# Patient Record
Sex: Male | Born: 1975 | Race: White | Hispanic: No | Marital: Married | Smoking: Never smoker
Health system: Southern US, Community
[De-identification: ages and names within clinical notes are randomized; demographics above are authoritative.]

## PROBLEM LIST (undated history)

## (undated) DIAGNOSIS — Z789 Other specified health status: Secondary | ICD-10-CM

## (undated) HISTORY — PX: NO PAST SURGERIES: SHX2092

---

## 2012-08-29 ENCOUNTER — Ambulatory Visit: Payer: Self-pay | Admitting: Physical Medicine and Rehabilitation

## 2012-10-31 ENCOUNTER — Other Ambulatory Visit: Payer: Self-pay | Admitting: Neurosurgery

## 2012-11-08 ENCOUNTER — Encounter (HOSPITAL_COMMUNITY): Payer: Self-pay

## 2012-11-11 ENCOUNTER — Encounter (HOSPITAL_COMMUNITY): Payer: Self-pay

## 2012-11-11 ENCOUNTER — Encounter (HOSPITAL_COMMUNITY)
Admission: RE | Admit: 2012-11-11 | Discharge: 2012-11-11 | Disposition: A | Discharge status: Home / Self Care | Payer: 59 | Source: Ambulatory Visit | Attending: Neurosurgery | Admitting: Neurosurgery

## 2012-11-11 HISTORY — DX: Other specified health status: Z78.9

## 2012-11-11 LAB — URINALYSIS, ROUTINE W REFLEX MICROSCOPIC
Ketones, ur: NEGATIVE mg/dL
Leukocytes, UA: NEGATIVE
Nitrite: NEGATIVE
Protein, ur: NEGATIVE mg/dL
pH: 6 (ref 5.0–8.0)

## 2012-11-11 LAB — CBC WITH DIFFERENTIAL/PLATELET
Basophils Absolute: 0 10*3/uL (ref 0.0–0.1)
Eosinophils Absolute: 0.2 10*3/uL (ref 0.0–0.7)
Eosinophils Relative: 3 % (ref 0–5)
Lymphocytes Relative: 40 % (ref 12–46)
MCV: 77.2 fL — ABNORMAL LOW (ref 78.0–100.0)
Neutrophils Relative %: 48 % (ref 43–77)
Platelets: 249 10*3/uL (ref 150–400)
RDW: 14.4 % (ref 11.5–15.5)
WBC: 6.1 10*3/uL (ref 4.0–10.5)

## 2012-11-11 LAB — BASIC METABOLIC PANEL
CO2: 30 mEq/L (ref 19–32)
Calcium: 8.6 mg/dL (ref 8.4–10.5)
Creatinine, Ser: 0.94 mg/dL (ref 0.50–1.35)
GFR calc Af Amer: >90 mL/min (ref >90)
GFR calc non Af Amer: >90 mL/min (ref >90)
Sodium: 139 mEq/L (ref 135–145)

## 2012-11-11 NOTE — Pre-Procedure Instructions (Signed)
ZACH TIETJE  11/11/2012   Your procedure is scheduled on:  11/15/12  Report to [facility] at 530 AM.  Call this number if you have problems the morning of surgery: [phone]   Remember:   Do not eat food or drink liquids after midnight.   Take these medicines the morning of surgery with A SIP OF WATER: neurontin,pain med., carafate   Do not wear jewelry, make-up or nail polish.  Do not wear lotions, powders, or perfumes. You may wear deodorant.  Do not shave 48 hours prior to surgery. Men may shave face and neck.  Do not bring valuables to the hospital.  [facility] is not responsible                   for any belongings or valuables.  Contacts, dentures or bridgework may not be worn into surgery.  Leave suitcase in the car. After surgery it may be brought to your room.  For patients admitted to the hospital, checkout time is 11:00 AM the day of  discharge.   Patients discharged the day of surgery will not be allowed to drive  home.  Name and phone number of your driver: family  Special Instructions: Shower using CHG 2 nights before surgery and the night before surgery.  If you shower the day of surgery use CHG.  Use special wash - you have one bottle of CHG for all showers.  You should use approximately 1/3 of the bottle for each shower.   Please read over the following fact sheets that you were given: Pain Booklet, Coughing and Deep Breathing, MRSA Information and Surgical Site Infection Prevention

## 2012-11-14 MED ORDER — CEFAZOLIN SODIUM-DEXTROSE 2-3 GM-% IV SOLR
2.0000 g | INTRAVENOUS | Status: AC
  Administered 2012-11-15: 2 g via INTRAVENOUS
  Filled 2012-11-14: qty 50

## 2012-11-15 ENCOUNTER — Ambulatory Visit (HOSPITAL_COMMUNITY): Payer: 59

## 2012-11-15 ENCOUNTER — Encounter (HOSPITAL_COMMUNITY): Payer: Self-pay | Admitting: Anesthesiology

## 2012-11-15 ENCOUNTER — Ambulatory Visit (HOSPITAL_COMMUNITY): Payer: 59 | Admitting: Anesthesiology

## 2012-11-15 ENCOUNTER — Encounter (HOSPITAL_COMMUNITY)
Admission: RE | Disposition: A | Discharge status: Home / Self Care | Payer: Self-pay | Source: Ambulatory Visit | Attending: Neurosurgery

## 2012-11-15 ENCOUNTER — Observation Stay (HOSPITAL_COMMUNITY)
Admission: RE | Admit: 2012-11-15 | Discharge: 2012-11-15 | Disposition: A | Discharge status: Home / Self Care | Payer: 59 | Source: Ambulatory Visit | Attending: Neurosurgery | Admitting: Neurosurgery

## 2012-11-15 DIAGNOSIS — IMO0002 Reserved for concepts with insufficient information to code with codable children: Principal | ICD-10-CM | POA: Insufficient documentation

## 2012-11-15 DIAGNOSIS — Z01812 Encounter for preprocedural laboratory examination: Secondary | ICD-10-CM | POA: Insufficient documentation

## 2012-11-15 DIAGNOSIS — Z79899 Other long term (current) drug therapy: Secondary | ICD-10-CM | POA: Insufficient documentation

## 2012-11-15 HISTORY — PX: LUMBAR LAMINECTOMY/DECOMPRESSION MICRODISCECTOMY: SHX5026

## 2012-11-15 SURGERY — LUMBAR LAMINECTOMY/DECOMPRESSION MICRODISCECTOMY 1 LEVEL
Anesthesia: General | Site: Back | Laterality: Bilateral | Wound class: Clean

## 2012-11-15 MED ORDER — OXYCODONE-ACETAMINOPHEN 5-325 MG PO TABS
1.0000 | ORAL_TABLET | ORAL | Status: DC | PRN
  Administered 2012-11-15: 2 via ORAL
  Filled 2012-11-15: qty 2

## 2012-11-15 MED ORDER — NEOSTIGMINE METHYLSULFATE 1 MG/ML IJ SOLN
INTRAMUSCULAR | Status: DC | PRN
  Administered 2012-11-15: 3 mg via INTRAVENOUS

## 2012-11-15 MED ORDER — HEMOSTATIC AGENTS (NO CHARGE) OPTIME
TOPICAL | Status: DC | PRN
  Administered 2012-11-15: 1 via TOPICAL

## 2012-11-15 MED ORDER — SODIUM CHLORIDE 0.9 % IR SOLN
Status: DC | PRN
  Administered 2012-11-15: 08:00:00

## 2012-11-15 MED ORDER — MAGNESIUM HYDROXIDE 400 MG/5ML PO SUSP: 30.0000 mL | Freq: Every day | ORAL | Status: DC | PRN

## 2012-11-15 MED ORDER — DOCUSATE SODIUM 100 MG PO CAPS
100.0000 mg | ORAL_CAPSULE | Freq: Two times a day (BID) | ORAL | Status: DC
  Administered 2012-11-15: 100 mg via ORAL
  Filled 2012-11-15: qty 1

## 2012-11-15 MED ORDER — HYDROMORPHONE HCL PF 1 MG/ML IJ SOLN
0.2500 mg | INTRAMUSCULAR | Status: DC | PRN
  Administered 2012-11-15 (×2): 0.5 mg via INTRAVENOUS

## 2012-11-15 MED ORDER — SUCRALFATE 1 G PO TABS
1.0000 g | ORAL_TABLET | Freq: Every day | ORAL | Status: DC
  Administered 2012-11-15: 1 g via ORAL
  Filled 2012-11-15: qty 1

## 2012-11-15 MED ORDER — CHLORHEXIDINE GLUCONATE CLOTH 2 % EX PADS
6.0000 | MEDICATED_PAD | Freq: Every day | CUTANEOUS | Status: DC
  Administered 2012-11-15: 6 via TOPICAL

## 2012-11-15 MED ORDER — LIDOCAINE-EPINEPHRINE 1 %-1:100000 IJ SOLN
INTRAMUSCULAR | Status: DC | PRN
  Administered 2012-11-15: 20 mL

## 2012-11-15 MED ORDER — HYDROCODONE-ACETAMINOPHEN 5-325 MG PO TABS: 1.0000 | ORAL_TABLET | ORAL | Status: DC | PRN

## 2012-11-15 MED ORDER — DEXAMETHASONE SODIUM PHOSPHATE 10 MG/ML IJ SOLN
INTRAMUSCULAR | Status: DC | PRN
  Administered 2012-11-15: 10 mg via INTRAVENOUS

## 2012-11-15 MED ORDER — MORPHINE SULFATE 2 MG/ML IJ SOLN: 1.0000 mg | INTRAMUSCULAR | Status: DC | PRN

## 2012-11-15 MED ORDER — SODIUM CHLORIDE 0.9 % IJ SOLN: 3.0000 mL | INTRAMUSCULAR | Status: DC | PRN

## 2012-11-15 MED ORDER — ROCURONIUM BROMIDE 100 MG/10ML IV SOLN
INTRAVENOUS | Status: DC | PRN
  Administered 2012-11-15: 50 mg via INTRAVENOUS

## 2012-11-15 MED ORDER — ACETAMINOPHEN 650 MG RE SUPP: 650.0000 mg | RECTAL | Status: DC | PRN

## 2012-11-15 MED ORDER — LACTATED RINGERS IV SOLN: INTRAVENOUS | Status: DC

## 2012-11-15 MED ORDER — CEFAZOLIN SODIUM 1-5 GM-% IV SOLN
1.0000 g | Freq: Three times a day (TID) | INTRAVENOUS | Status: DC
  Filled 2012-11-15 (×2): qty 50

## 2012-11-15 MED ORDER — METHOCARBAMOL 500 MG PO TABS
500.0000 mg | ORAL_TABLET | Freq: Four times a day (QID) | ORAL | Status: DC | PRN
  Administered 2012-11-15: 500 mg via ORAL

## 2012-11-15 MED ORDER — OXYCODONE HCL 5 MG PO TABS
ORAL_TABLET | ORAL | Status: AC
  Filled 2012-11-15: qty 1

## 2012-11-15 MED ORDER — PROPOFOL 10 MG/ML IV BOLUS
INTRAVENOUS | Status: DC | PRN
  Administered 2012-11-15: 200 mg via INTRAVENOUS

## 2012-11-15 MED ORDER — GLYCOPYRROLATE 0.2 MG/ML IJ SOLN
INTRAMUSCULAR | Status: DC | PRN
  Administered 2012-11-15: 0.6 mg via INTRAVENOUS

## 2012-11-15 MED ORDER — THROMBIN 5000 UNITS EX SOLR
CUTANEOUS | Status: DC | PRN
  Administered 2012-11-15 (×2): 5000 [IU] via TOPICAL

## 2012-11-15 MED ORDER — GABAPENTIN 300 MG PO CAPS: 300.0000 mg | ORAL_CAPSULE | Freq: Every day | ORAL | Status: AC

## 2012-11-15 MED ORDER — FENTANYL CITRATE 0.05 MG/ML IJ SOLN
INTRAMUSCULAR | Status: DC | PRN
  Administered 2012-11-15: 150 ug via INTRAVENOUS
  Administered 2012-11-15: 100 ug via INTRAVENOUS
  Administered 2012-11-15 (×2): 50 ug via INTRAVENOUS

## 2012-11-15 MED ORDER — METHOCARBAMOL 500 MG PO TABS
ORAL_TABLET | ORAL | Status: AC
  Filled 2012-11-15: qty 1

## 2012-11-15 MED ORDER — 0.9 % SODIUM CHLORIDE (POUR BTL) OPTIME
TOPICAL | Status: DC | PRN
  Administered 2012-11-15: 1000 mL

## 2012-11-15 MED ORDER — LIDOCAINE HCL (CARDIAC) 20 MG/ML IV SOLN
INTRAVENOUS | Status: DC | PRN
  Administered 2012-11-15: 100 mg via INTRAVENOUS

## 2012-11-15 MED ORDER — MIDAZOLAM HCL 5 MG/5ML IJ SOLN
INTRAMUSCULAR | Status: DC | PRN
  Administered 2012-11-15: 2 mg via INTRAVENOUS

## 2012-11-15 MED ORDER — HYDROMORPHONE HCL PF 1 MG/ML IJ SOLN
INTRAMUSCULAR | Status: AC
  Filled 2012-11-15: qty 1

## 2012-11-15 MED ORDER — GABAPENTIN 300 MG PO CAPS
300.0000 mg | ORAL_CAPSULE | Freq: Two times a day (BID) | ORAL | Status: DC
  Administered 2012-11-15: 300 mg via ORAL
  Filled 2012-11-15 (×2): qty 1

## 2012-11-15 MED ORDER — CYCLOBENZAPRINE HCL 10 MG PO TABS: 10.0000 mg | ORAL_TABLET | Freq: Three times a day (TID) | ORAL | Status: DC | PRN

## 2012-11-15 MED ORDER — SODIUM CHLORIDE 0.9 % IJ SOLN: 3.0000 mL | Freq: Two times a day (BID) | INTRAMUSCULAR | Status: DC

## 2012-11-15 MED ORDER — LACTATED RINGERS IV SOLN
INTRAVENOUS | Status: DC | PRN
  Administered 2012-11-15 (×2): via INTRAVENOUS

## 2012-11-15 MED ORDER — SODIUM CHLORIDE 0.9 % IV SOLN: 250.0000 mL | INTRAVENOUS | Status: DC

## 2012-11-15 MED ORDER — FENTANYL CITRATE 0.05 MG/ML IJ SOLN: 50.0000 ug | Freq: Once | INTRAMUSCULAR | Status: DC

## 2012-11-15 MED ORDER — ONDANSETRON HCL 4 MG/2ML IJ SOLN: 4.0000 mg | INTRAMUSCULAR | Status: DC | PRN

## 2012-11-15 MED ORDER — CEFAZOLIN SODIUM-DEXTROSE 2-3 GM-% IV SOLR
INTRAVENOUS | Status: AC
  Filled 2012-11-15: qty 50

## 2012-11-15 MED ORDER — MUPIROCIN 2 % EX OINT
1.0000 "application " | TOPICAL_OINTMENT | Freq: Two times a day (BID) | CUTANEOUS | Status: DC
  Administered 2012-11-15: 1 via NASAL
  Filled 2012-11-15: qty 22

## 2012-11-15 MED ORDER — PROMETHAZINE HCL 25 MG/ML IJ SOLN: 6.2500 mg | INTRAMUSCULAR | Status: DC | PRN

## 2012-11-15 MED ORDER — OXYCODONE HCL 5 MG/5ML PO SOLN: 5.0000 mg | Freq: Once | ORAL | Status: AC | PRN

## 2012-11-15 MED ORDER — METHOCARBAMOL 100 MG/ML IJ SOLN
500.0000 mg | Freq: Four times a day (QID) | INTRAVENOUS | Status: DC | PRN
  Filled 2012-11-15: qty 5

## 2012-11-15 MED ORDER — SODIUM CHLORIDE 0.9 % IV SOLN
INTRAVENOUS | Status: AC
  Filled 2012-11-15: qty 500

## 2012-11-15 MED ORDER — MIDAZOLAM HCL 2 MG/2ML IJ SOLN: 1.0000 mg | INTRAMUSCULAR | Status: DC | PRN

## 2012-11-15 MED ORDER — KETOROLAC TROMETHAMINE 30 MG/ML IJ SOLN: 30.0000 mg | Freq: Four times a day (QID) | INTRAMUSCULAR | Status: DC

## 2012-11-15 MED ORDER — OXYCODONE HCL 5 MG PO TABS
5.0000 mg | ORAL_TABLET | Freq: Once | ORAL | Status: AC | PRN
  Administered 2012-11-15: 5 mg via ORAL

## 2012-11-15 MED ORDER — ACETAMINOPHEN 325 MG PO TABS: 650.0000 mg | ORAL_TABLET | ORAL | Status: DC | PRN

## 2012-11-15 MED ORDER — BACITRACIN 50000 UNITS IM SOLR
INTRAMUSCULAR | Status: AC
  Filled 2012-11-15: qty 1

## 2012-11-15 SURGICAL SUPPLY — 52 items
BAG DECANTER FOR FLEXI CONT (MISCELLANEOUS) ×2 IMPLANT
BENZOIN TINCTURE PRP APPL 2/3 (GAUZE/BANDAGES/DRESSINGS) ×2 IMPLANT
BLADE SURG ROTATE 9660 (MISCELLANEOUS) IMPLANT
BUR ROUND FLUTED 5 RND (BURR) ×2 IMPLANT
CANISTER SUCTION 2500CC (MISCELLANEOUS) ×2 IMPLANT
CLOTH BEACON ORANGE TIMEOUT ST (SAFETY) ×2 IMPLANT
CONT SPEC 4OZ CLIKSEAL STRL BL (MISCELLANEOUS) IMPLANT
DRAPE LAPAROTOMY 100X72X124 (DRAPES) ×2 IMPLANT
DRAPE MICROSCOPE LEICA (MISCELLANEOUS) ×2 IMPLANT
DRAPE POUCH INSTRU U-SHP 10X18 (DRAPES) ×2 IMPLANT
DRAPE SURG 17X23 STRL (DRAPES) ×2 IMPLANT
DRESSING TELFA 8X3 (GAUZE/BANDAGES/DRESSINGS) ×2 IMPLANT
DURAPREP 26ML APPLICATOR (WOUND CARE) ×2 IMPLANT
ELECT REM PT RETURN 9FT ADLT (ELECTROSURGICAL) ×2
ELECTRODE REM PT RTRN 9FT ADLT (ELECTROSURGICAL) ×1 IMPLANT
GAUZE SPONGE 4X4 16PLY XRAY LF (GAUZE/BANDAGES/DRESSINGS) IMPLANT
GLOVE BIOGEL PI IND STRL 7.0 (GLOVE) ×3 IMPLANT
GLOVE BIOGEL PI INDICATOR 7.0 (GLOVE) ×3
GLOVE ECLIPSE 7.5 STRL STRAW (GLOVE) ×2 IMPLANT
GLOVE EXAM NITRILE LRG STRL (GLOVE) IMPLANT
GLOVE EXAM NITRILE MD LF STRL (GLOVE) IMPLANT
GLOVE EXAM NITRILE XL STR (GLOVE) IMPLANT
GLOVE EXAM NITRILE XS STR PU (GLOVE) IMPLANT
GLOVE SURG SS PI 7.0 STRL IVOR (GLOVE) ×6 IMPLANT
GOWN BRE IMP SLV AUR LG STRL (GOWN DISPOSABLE) ×2 IMPLANT
GOWN BRE IMP SLV AUR XL STRL (GOWN DISPOSABLE) ×6 IMPLANT
GOWN STRL REIN 2XL LVL4 (GOWN DISPOSABLE) IMPLANT
KIT BASIN OR (CUSTOM PROCEDURE TRAY) ×2 IMPLANT
KIT ROOM TURNOVER OR (KITS) ×2 IMPLANT
NEEDLE HYPO 18GX1.5 BLUNT FILL (NEEDLE) IMPLANT
NEEDLE HYPO 22GX1.5 SAFETY (NEEDLE) ×4 IMPLANT
NS IRRIG 1000ML POUR BTL (IV SOLUTION) ×2 IMPLANT
PACK LAMINECTOMY NEURO (CUSTOM PROCEDURE TRAY) ×2 IMPLANT
PAD ARMBOARD 7.5X6 YLW CONV (MISCELLANEOUS) ×6 IMPLANT
PATTIES SURGICAL .75X.75 (GAUZE/BANDAGES/DRESSINGS) ×2 IMPLANT
RUBBERBAND STERILE (MISCELLANEOUS) ×4 IMPLANT
SPONGE GAUZE 4X4 12PLY (GAUZE/BANDAGES/DRESSINGS) ×2 IMPLANT
SPONGE LAP 4X18 X RAY DECT (DISPOSABLE) IMPLANT
SPONGE SURGIFOAM ABS GEL SZ50 (HEMOSTASIS) ×2 IMPLANT
STRIP CLOSURE SKIN 1/2X4 (GAUZE/BANDAGES/DRESSINGS) ×2 IMPLANT
SUT PROLENE 6 0 BV (SUTURE) IMPLANT
SUT VIC AB 0 CT1 18XCR BRD8 (SUTURE) ×1 IMPLANT
SUT VIC AB 0 CT1 8-18 (SUTURE) ×1
SUT VIC AB 2-0 CP2 18 (SUTURE) ×2 IMPLANT
SUT VIC AB 3-0 SH 8-18 (SUTURE) ×2 IMPLANT
SYR 20ML ECCENTRIC (SYRINGE) ×2 IMPLANT
SYR 5ML LL (SYRINGE) IMPLANT
TAPE CLOTH SURG 4X10 WHT LF (GAUZE/BANDAGES/DRESSINGS) ×2 IMPLANT
TAPE STRIPS DRAPE STRL (GAUZE/BANDAGES/DRESSINGS) ×2 IMPLANT
TOWEL OR 17X24 6PK STRL BLUE (TOWEL DISPOSABLE) ×2 IMPLANT
TOWEL OR 17X26 10 PK STRL BLUE (TOWEL DISPOSABLE) ×2 IMPLANT
WATER STERILE IRR 1000ML POUR (IV SOLUTION) ×2 IMPLANT

## 2012-11-15 NOTE — Preoperative (Signed)
Beta Blockers   Reason not to administer Beta Blockers:Not Applicable 

## 2012-11-15 NOTE — Anesthesia Postprocedure Evaluation (Signed)
  Anesthesia Post-op Note  Patient: Aaron Wright  Procedure(s) Performed: Procedure(s) with comments: LUMBAR LAMINECTOMY/DECOMPRESSION MICRODISCECTOMY 1 LEVEL (Bilateral) - Bilateral L4-5 Laminectomy  Patient Location: PACU  Anesthesia Type:General  Level of Consciousness: awake  Airway and Oxygen Therapy: Patient Spontanous Breathing  Post-op Pain: mild  Post-op Assessment: Post-op Vital signs reviewed, Patient's Cardiovascular Status Stable, Respiratory Function Stable, Patent Airway, No signs of Nausea or vomiting and Pain level controlled  Post-op Vital Signs: stable  Complications: No apparent anesthesia complications

## 2012-11-15 NOTE — Progress Notes (Signed)
UR COMPLETED  

## 2012-11-15 NOTE — Interval H&P Note (Signed)
History and Physical Interval Note:  11/15/2012 7:43 AM  Aaron Wright  has presented today for surgery, with the diagnosis of Lumbar stenosis  The various methods of treatment have been discussed with the patient and family. After consideration of risks, benefits and other options for treatment, the patient has consented to  Procedure(s) with comments: LUMBAR LAMINECTOMY/DECOMPRESSION MICRODISCECTOMY 1 LEVEL (Bilateral) - Bilateral L4-5 Laminectomy as a surgical intervention .  The patient's history has been reviewed, patient examined, no change in status, stable for surgery.  I have reviewed the patient's chart and labs.  Questions were answered to the patient's satisfaction.     [name] R

## 2012-11-15 NOTE — Op Note (Signed)
11/15/2012  9:45 AM  PATIENT:  Aaron Wright  37 y.o. male  PRE-OPERATIVE DIAGNOSIS:  Lumbar stenosis, HNP, radiculopathy  POST-OPERATIVE DIAGNOSIS:   Lumbar stenosis, HNP, radiculopathy  PROCEDURE:  Procedure(s): DECOMPRESSive LUMBAR LAMINECTOMY L4-5 Left ,  with discectomy  - microdisection   SURGEON:  Surgeon(s): Clydene Fake, MD Temple Pacini, MD-assist   ANESTHESIA:   general  EBL:   minimal  BLOOD ADMINISTERED:none  DRAINS: none   SPECIMEN:  No Specimen . DICTATION: Patient back and left leg pain numbness.  MRI was done showing rectal pieces degenerative disease changes at L4-5 with markers at this herniation facet hypertrophy and ligament hypertrophy all causing significant stenosis. Flexion extension views were done and did not show any instability.   Patient underwent epidural injection dictation brief relief but continued with pain especially down the left leg and after much discussion was decided to proceed with a decompressive laminectomy and discectomy concentrating on the left side.  Patient brought in the operating room general anesthesia induced patient placed in prone position Wilson frame all pressure points padded. Patient prepped draped sterile fashion 7 incision injected with 20 cc 1% lidocaine with epinephrine. Needle was placed interspace x-rays attention needle was 0.51 interspace incision was then made centered just below with a needle was incision taken the fascia hemostasis obtained with position fascia was incised and subperiosteal dissection was done over the left side of the spinous process lamina facets are placed markers in 2 interspaces and weakness he took another x-ray lower marker was at the L4-5 level. We dissected the right side suppressed dissection and placed in soaking retractors we could see the 45 disc space. Microscope was then brought in for microdissection. Starting on the left side didn't high-speed drill was used to starting  semi-hemi-decompressive laminectomy and medial facetectomy. The implant was then removed we decompressed the central canal explored the epidural space and found a huge disc protrusion. Hemostasis of the her under membrane we went through the tendon brain and loose fragments of disc were easily removed we're will use her S1 nerve hooks and x-ray pressure disc from across midline of back into her feel done the left side this greatly decompress the canal. There was a large annular hole in we did discectomy within the disc space using curettes pituitary rongeurs. We were finished we did decompression central canal and the 4 and 5 the nerve roots a hearing loss of reduction with lumbar hooks and dilators to the opposite side and really hasn't pushed a that disc protrusion) removing most of it and if we are will dictate over towards the left side at this point we decided not to open up into the right side space was symptomatic on the left and we felt that we very good decompression of the right side. It hemostasis with Gelfoam thrombin was. Out we very good hemostasis we then checked the dura central canal and nerve roots were well good decompression. Hemostasis retractors removed fascia closed with 0 Vicryl interrupted sutures of his tissue closed with 021 through Vicryl interrupted sutures skin closed benzoin Steri-Strips patient was transferred to the spinal position woken (and transferred to recover  PLAN OF CARE: Admit to inpatient   PATIENT DISPOSITION:  PACU - hemodynamically stable.

## 2012-11-15 NOTE — Transfer of Care (Signed)
Immediate Anesthesia Transfer of Care Note  Patient: Aaron Wright  Procedure(s) Performed: Procedure(s) with comments: LUMBAR LAMINECTOMY/DECOMPRESSION MICRODISCECTOMY 1 LEVEL (Bilateral) - Bilateral L4-5 Laminectomy  Patient Location: PACU  Anesthesia Type:General  Level of Consciousness: awake and alert   Airway & Oxygen Therapy: Patient Spontanous Breathing  Post-op Assessment: Report given to PACU RN and Post -op Vital signs reviewed and stable  Post vital signs: Reviewed and stable  Complications: No apparent anesthesia complications

## 2012-11-15 NOTE — Anesthesia Preprocedure Evaluation (Signed)
Anesthesia Evaluation  Patient identified by MRN, date of birth, ID band Patient awake    Reviewed: Allergy & Precautions, H&P , NPO status , Patient's Chart, lab work & pertinent test results  Airway Mallampati: I TM Distance: >3 FB Neck ROM: Full    Dental   Pulmonary  breath sounds clear to auscultation        Cardiovascular Rhythm:Regular Rate:Normal     Neuro/Psych    GI/Hepatic   Endo/Other    Renal/GU      Musculoskeletal   Abdominal   Peds  Hematology   Anesthesia Other Findings   Reproductive/Obstetrics                           Anesthesia Physical Anesthesia Plan  ASA: I  Anesthesia Plan: General   Post-op Pain Management:    Induction: Intravenous  Airway Management Planned: Oral ETT  Additional Equipment:   Intra-op Plan:   Post-operative Plan: Extubation in OR  Informed Consent: I have reviewed the patients History and Physical, chart, labs and discussed the procedure including the risks, benefits and alternatives for the proposed anesthesia with the patient or authorized representative who has indicated his/her understanding and acceptance.     Plan Discussed with: CRNA and Surgeon  Anesthesia Plan Comments:         Anesthesia Quick Evaluation  

## 2012-11-15 NOTE — Progress Notes (Signed)
Pt doing well. Pt is OOB ambulating without difficulty. Pt is voiding. Pt given D/C instructions with Rx's, verbal understanding given. Pt D/C'd home via wheelchair @ 1545 per MD order. Rema Fendt, RN

## 2012-11-15 NOTE — H&P (Signed)
See H& P.

## 2012-11-15 NOTE — Anesthesia Procedure Notes (Signed)
Procedure Name: Intubation Date/Time: 11/15/2012 7:57 AM Performed by: Gwenyth Allegra Pre-anesthesia Checklist: Patient identified, Timeout performed, Emergency Drugs available, Suction available and Patient being monitored Patient Re-evaluated:Patient Re-evaluated prior to inductionOxygen Delivery Method: Circle system utilized Preoxygenation: Pre-oxygenation with 100% oxygen Intubation Type: IV induction Ventilation: Mask ventilation without difficulty Laryngoscope Size: Mac and 4 Grade View: Grade I Tube type: Oral Tube size: 8.0 mm Number of attempts: 1 Airway Equipment and Method: Stylet Placement Confirmation: ETT inserted through vocal cords under direct vision,  breath sounds checked- equal and bilateral and positive ETCO2 Secured at: 22 cm Tube secured with: Tape Dental Injury: Teeth and Oropharynx as per pre-operative assessment

## 2012-11-15 NOTE — Discharge Summary (Signed)
Physician Discharge Summary  Patient ID: Aaron Wright MRN: [id number] DOB/AGE: [id number] y.o.  Admit date: 11/15/2012 Discharge date: 11/15/2012  Admission Diagnoses:Lumbar stenosis, HNP, radiculopathy , L4-5     Discharge Diagnoses: Lumbar stenosis, HNP, radiculopathy , L4-5    Active Problems:   * No active hospital problems. *   Discharged Condition: good  Hospital Course: pt admitted on day of surgery  - underwent procedure below  - pt doing well  - ambulating, voiding  Consults: None    Treatments: surgery: DECOMPRESSive LUMBAR LAMINECTOMY L4-5 Left , with discectomy - microdisection   Discharge Exam: Blood pressure 144/88, pulse 77, temperature 97.8 F (36.6 C), temperature source Oral, resp. rate 20, SpO2 98.00%. Wound:c/d/i  Disposition: home     Medication List         calcium carbonate 600 MG Tabs  Commonly known as:  OS-CAL  Take 600 mg by mouth daily.     CHLOR-TABLETS PO  Take 1 tablet by mouth daily.     CRANBERRY CONCENTRATE PO  Take 84 mg by mouth daily.     cyclobenzaprine 10 MG tablet  Commonly known as:  FLEXERIL  Take 10 mg by mouth 3 (three) times daily as needed for muscle spasms.     gabapentin 300 MG capsule  Commonly known as:  NEURONTIN  Take 1 capsule (300 mg total) by mouth at bedtime.     glucosamine-chondroitin 500-400 MG tablet  Take 1 tablet by mouth daily.     HYDROcodone-acetaminophen 10-325 MG per tablet  Commonly known as:  NORCO  Take 1 tablet by mouth 4 (four) times daily as needed for pain.     meloxicam 7.5 MG tablet  Commonly known as:  MOBIC  Take 7.5 mg by mouth 2 (two) times daily.     multivitamin with minerals tablet  Take 1 tablet by mouth daily.     HAIR/SKIN/NAILS PO  Take 1 tablet by mouth daily.     sucralfate 1 G tablet  Commonly known as:  CARAFATE  Take 1 g by mouth daily.         SignedClydene Fake, MD 11/15/2012, 9:52 AM

## 2012-11-17 ENCOUNTER — Encounter (HOSPITAL_COMMUNITY): Payer: Self-pay | Admitting: Neurosurgery

## 2014-01-15 IMAGING — DX DG LUMBAR SPINE 2-3V
1 series · 1 of 1 positions shown · non-contrast
Comparison: None.

CLINICAL DATA: Lumbar stenosis, bilateral L4-5 laminectomy

LUMBAR SPINE - 2-3 VIEW

[lat]
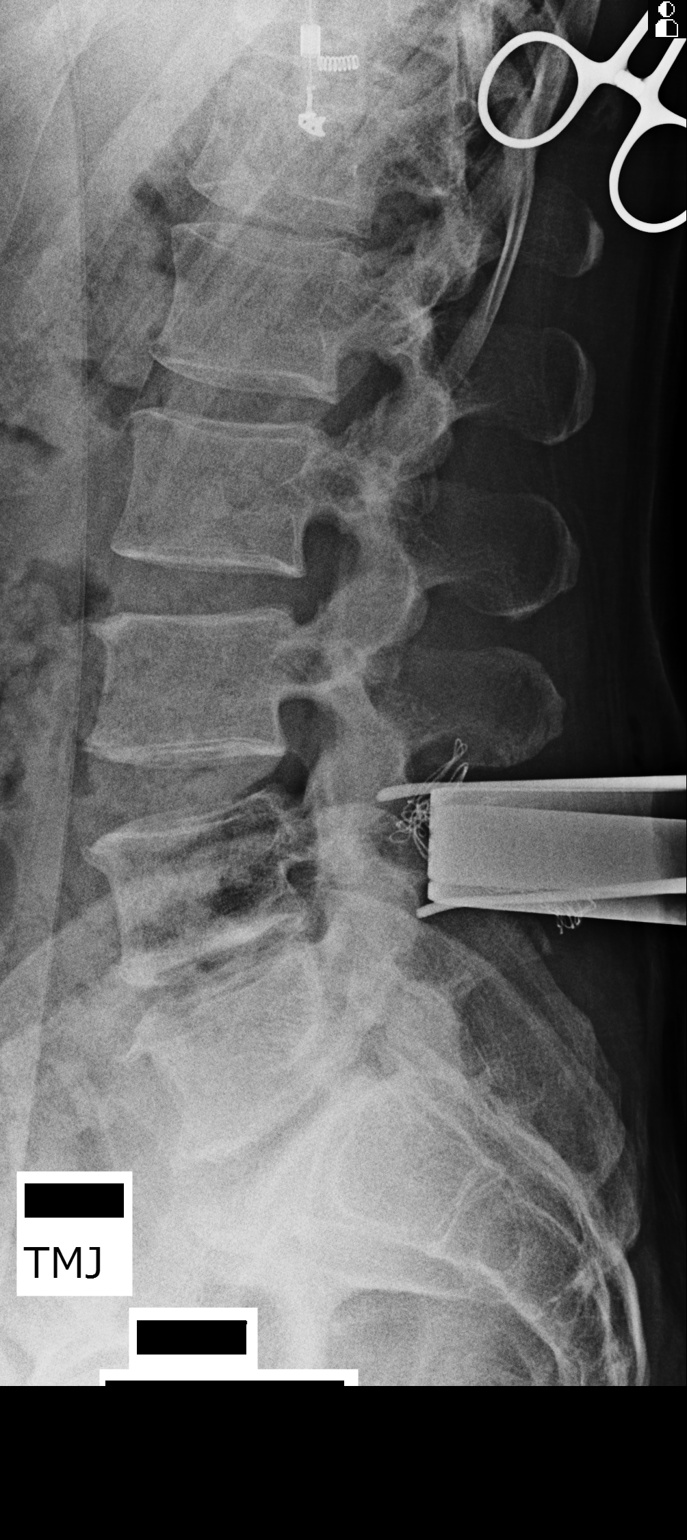

[1 of 1 positions shown; findings below may reference images not displayed]

FINDINGS: Lateral intraoperative lumbar spine radiographs.  First
radiograph shows a probe directed toward the inferior surface of
the L5 spinous process.  Second image shows a probe directed toward
the superior endplate of L4 and the second probe directed toward
the L4-5 disc space.
IMPRESSION: Intraoperative localization.

## 2017-01-29 ENCOUNTER — Encounter (INDEPENDENT_AMBULATORY_CARE_PROVIDER_SITE_OTHER): Payer: BLUE CROSS/BLUE SHIELD | Admitting: Vascular Surgery

## 2017-02-26 ENCOUNTER — Ambulatory Visit (INDEPENDENT_AMBULATORY_CARE_PROVIDER_SITE_OTHER): Payer: BLUE CROSS/BLUE SHIELD | Admitting: Vascular Surgery

## 2017-02-26 ENCOUNTER — Encounter (INDEPENDENT_AMBULATORY_CARE_PROVIDER_SITE_OTHER): Payer: Self-pay | Admitting: Vascular Surgery

## 2017-02-26 DIAGNOSIS — I83812 Varicose veins of left lower extremities with pain: Secondary | ICD-10-CM | POA: Diagnosis not present

## 2017-02-26 NOTE — Progress Notes (Signed)
Patient ID: Aaron Wright, male   DOB: [birth year], 41 y.o.   MRN: [id number]  Chief Complaint  Patient presents with  . Varicose Veins    ref Quillian Quince    HPI Aaron Wright is a 41 y.o. male.  I am asked to see the patient by Dr. Quillian Quince for evaluation of varicose veins.  The patient presents with complaints of symptomatic varicosities of the left leg. The patient reports a long standing history of varicosities and they have become painful over time. There was no clear inciting event or causative factor that started the symptoms.  The left leg is more severly affected. The patient elevates the legs for relief. The pain is described as stinging and burning particularly overlying the varicosities. The symptoms are generally most severe in the evening, particularly when they have been on their feet for long periods of time.  Elevation and increased activity has been used to try to improve the symptoms with some success. The patient complains of occasional swelling as an associated symptom. The patient has no previous history of deep venous thrombosis or superficial thrombophlebitis to their knowledge.     Past Medical History:  Diagnosis Date  . Medical history non-contributory     Past Surgical History:  Procedure Laterality Date  . LUMBAR LAMINECTOMY/DECOMPRESSION MICRODISCECTOMY Bilateral 11/15/2012   Procedure: LUMBAR LAMINECTOMY/DECOMPRESSION MICRODISCECTOMY 1 LEVEL;  Surgeon: Clydene Fake, MD;  Location: [facility];  Service: Neurosurgery;  Laterality: Bilateral;  Bilateral L4-5 Laminectomy  . NO PAST SURGERIES     Family History No bleeding or clotting disorders  Social History Social History  Substance Use Topics  . Smoking status: Never Smoker  . Smokeless tobacco: Never Used  . Alcohol use Yes     Comment: weekly     Allergies  Allergen Reactions  . Shellfish Allergy Swelling    Throat swells    Current Outpatient Prescriptions  Medication Sig Dispense  Refill  . cephALEXin (KEFLEX) 500 MG capsule TK 1 C PO TID  0  . cetirizine (ZYRTEC) 10 MG tablet Take by mouth.    . Multiple Vitamins-Minerals (HAIR/SKIN/NAILS PO) Take 1 tablet by mouth daily.    . Multiple Vitamins-Minerals (MULTIVITAMIN WITH MINERALS) tablet Take 1 tablet by mouth daily.    . Vitamins C E (CRANBERRY CONCENTRATE PO) Take 84 mg by mouth daily.    . calcium carbonate (OS-CAL) 600 MG TABS Take 600 mg by mouth daily.    . Chlorpheniramine Maleate (CHLOR-TABLETS PO) Take 1 tablet by mouth daily.    . cyclobenzaprine (FLEXERIL) 10 MG tablet Take 10 mg by mouth 3 (three) times daily as needed for muscle spasms.    Marland Kitchen gabapentin (NEURONTIN) 300 MG capsule Take 1 capsule (300 mg total) by mouth at bedtime. (Patient not taking: Reported on 02/26/2017) 10 capsule 0  . glucosamine-chondroitin 500-400 MG tablet Take 1 tablet by mouth daily.    Marland Kitchen HYDROcodone-acetaminophen (NORCO) 10-325 MG per tablet Take 1 tablet by mouth 4 (four) times daily as needed for pain.    . meloxicam (MOBIC) 7.5 MG tablet Take 7.5 mg by mouth 2 (two) times daily.    . Prenat-FeCbn-FeBisg-FA-Omega (MULTIVITAMIN/MINERALS PO) Take by mouth.    . sucralfate (CARAFATE) 1 G tablet Take 1 g by mouth daily.     No current facility-administered medications for this visit.       REVIEW OF SYSTEMS (Negative unless checked)  Constitutional: Weight loss  Fever  Chills Cardiac: Chest pain     Chest pressure   Palpitations   Shortness of breath when laying flat   Shortness of breath at rest   Shortness of breath with exertion. Vascular:  Pain in legs with walking   Pain in legs at rest   Pain in legs when laying flat   Claudication   Pain in feet when walking  Pain in feet at rest  Pain in feet when laying flat   History of DVT   Phlebitis   Swelling in legs   Varicose veins   Non-healing ulcers Pulmonary:   Uses home oxygen   Productive cough   Hemoptysis   Wheeze  COPD    Asthma Neurologic:  Dizziness  Blackouts   Seizures   History of stroke   History of TIA  Aphasia   Temporary blindness   Dysphagia   Weakness or numbness in arms   Weakness or numbness in legs Musculoskeletal:  Arthritis   Joint swelling   Joint pain   Low back pain Hematologic:  Easy bruising  Easy bleeding   Hypercoagulable state   Anemic  Hepatitis Gastrointestinal:  Blood in stool   Vomiting blood  Gastroesophageal reflux/heartburn   Abdominal pain Genitourinary:  Chronic kidney disease   Difficult urination  Frequent urination  Burning with urination   Hematuria Skin:  Rashes   Ulcers   Wounds Psychological:  History of anxiety    History of major depression.    Physical Exam BP (!) 149/91 (BP Location: Right Arm)   Pulse 80   Resp 17   Ht  (1.88 m)   Wt 88.3 kg (194 lb 9.6 oz)   BMI 24.99 kg/m  Gen:  WD/WN, NAD Head: [place]/AT, No temporalis wasting.  Ear/Nose/Throat: Hearing grossly intact, dentition good Eyes: Sclera non-icteric. Conjunctiva clear Neck: Supple, no nuchal rigidity. Trachea midline Pulmonary:  Good air movement, no use of accessory muscles, respirations not labored.  Cardiac: RRR, No JVD Vascular: Varicosities scant in the right lower extremity        Varicosities prominent and measuring up to 3-4 mm in the left lower extremity Vessel Right Left  Radial Palpable Palpable                          PT Palpable Palpable  DP Palpable Palpable    Musculoskeletal: M/S 5/5 throughout.   No RLE edema. Trace LLE edema Neurologic: Sensation grossly intact in extremities.  Symmetrical.  Speech is fluent.  Psychiatric: Judgment intact, Mood & affect appropriate for pt's clinical situation. Dermatologic: No rashes or ulcers noted.  No cellulitis or open wounds.    Radiology No results found.  Labs No results found for this or any previous visit (from the past 2160  hour(s)).  Assessment/Plan:  Varicose veins of leg with pain, left      The patient has symptoms consistent with chronic venous insufficiency. We discussed the natural history and treatment options for venous disease. I recommended the regular use of 20 - 30 mm Hg compression stockings, and prescribed these today. I recommended leg elevation and anti-inflammatories as needed for pain. I have also recommended a complete venous duplex to assess the venous system for reflux or thrombotic issues. This can be done at the patient's convenience. I will see the patient back in 3 months to assess the response to conservative management, and determine further treatment options.     Festus Barren 02/26/2017, 10:57 AM   This note was created with [employer]  medical transcription system.  Any errors from dictation are unintentional.

## 2017-02-26 NOTE — Patient Instructions (Signed)
Varicose Veins Varicose veins are veins that have become enlarged and twisted. They are usually seen in the legs but can occur in other parts of the body as well. What are the causes? This condition is the result of valves in the veins not working properly. Valves in the veins help to return blood from the leg to the heart. If these valves are damaged, blood flows backward and backs up into the veins in the leg near the skin. This causes the veins to become larger. What increases the risk? People who are on their feet a lot, who are pregnant, or who are overweight are more likely to develop varicose veins. What are the signs or symptoms?  Bulging, twisted-appearing, bluish veins, most commonly found on the legs.  Leg pain or a feeling of heaviness. These symptoms may be worse at the end of the day.  Leg swelling.  Changes in skin color. How is this diagnosed? A health care provider can usually diagnose varicose veins by examining your legs. Your health care provider may also recommend an ultrasound of your leg veins. How is this treated? Most varicose veins can be treated at home.However, other treatments are available for people who have persistent symptoms or want to improve the cosmetic appearance of the varicose veins. These treatment options include:  Sclerotherapy. A solution is injected into the vein to close it off.  Laser treatment. A laser is used to heat the vein to close it off.  Radiofrequency vein ablation. An electrical current produced by radio waves is used to close off the vein.  Phlebectomy. The vein is surgically removed through small incisions made over the varicose vein.  Vein ligation and stripping. The vein is surgically removed through incisions made over the varicose vein after the vein has been tied (ligated). Follow these instructions at home:   Do not stand or sit in one position for long periods of time. Do not sit with your legs crossed. Rest with your  legs raised during the day.  Wear compression stockings as directed by your health care provider. These stockings help to prevent blood clots and reduce swelling in your legs.  Do not wear other tight, encircling garments around your legs, pelvis, or waist.  Walk as much as possible to increase blood flow.  Raise the foot of your bed at night with 2-inch blocks.  If you get a cut in the skin over the vein and the vein bleeds, lie down with your leg raised and press on it with a clean cloth until the bleeding stops. Then place a bandage (dressing) on the cut. See your health care provider if it continues to bleed. Contact a health care provider if:  The skin around your ankle starts to break down.  You have pain, redness, tenderness, or hard swelling in your leg over a vein.  You are uncomfortable because of leg pain. This information is not intended to replace advice given to you by your health care provider. Make sure you discuss any questions you have with your health care provider. Document Released: 02/11/2005 Document Revised: 10/10/2015 Document Reviewed: 11/05/2015 [employer] Interactive Patient Education  2017 [employer]  

## 2017-03-03 ENCOUNTER — Encounter (INDEPENDENT_AMBULATORY_CARE_PROVIDER_SITE_OTHER): Payer: Self-pay

## 2017-06-04 ENCOUNTER — Encounter (INDEPENDENT_AMBULATORY_CARE_PROVIDER_SITE_OTHER): Payer: BLUE CROSS/BLUE SHIELD

## 2017-06-04 ENCOUNTER — Ambulatory Visit (INDEPENDENT_AMBULATORY_CARE_PROVIDER_SITE_OTHER): Payer: BLUE CROSS/BLUE SHIELD | Admitting: Vascular Surgery

## 2017-07-23 ENCOUNTER — Ambulatory Visit (INDEPENDENT_AMBULATORY_CARE_PROVIDER_SITE_OTHER): Payer: BLUE CROSS/BLUE SHIELD

## 2017-07-23 ENCOUNTER — Encounter (INDEPENDENT_AMBULATORY_CARE_PROVIDER_SITE_OTHER): Payer: Self-pay | Admitting: Vascular Surgery

## 2017-07-23 ENCOUNTER — Ambulatory Visit (INDEPENDENT_AMBULATORY_CARE_PROVIDER_SITE_OTHER): Payer: BLUE CROSS/BLUE SHIELD | Admitting: Vascular Surgery

## 2017-07-23 VITALS — BP 148/89 | HR 72 | Resp 18 | Wt 196.0 lb

## 2017-07-23 DIAGNOSIS — I83812 Varicose veins of left lower extremities with pain: Secondary | ICD-10-CM

## 2017-07-23 NOTE — Progress Notes (Signed)
Patient ID: Aaron Wright, male   DOB: 08/25/1975, 42 y.o.   MRN: [id number]  Chief Complaint  Patient presents with  . Follow-up    3 month LLE venous reflux    HPI Aaron Wright is a 42 y.o. male.  Patient returns in follow up of their venous disease.  They have done their best to comply with the prescribed conservative therapies of compression stockings, leg elevation, exercise, and still requires anti-inflammatories for discomfort and has symptoms that are persistent and bothersome on a daily basis, affecting their activities of daily living and normal activities.  The symptoms are predominantly in the left leg.  The venous reflux study demonstrates no DVT or superficial thrombophlebitis, but significant left great saphenous vein reflux was seen.    Past Medical History:  Diagnosis Date  . Medical history non-contributory          Past Surgical History:  Procedure Laterality Date  . LUMBAR LAMINECTOMY/DECOMPRESSION MICRODISCECTOMY Bilateral 11/15/2012   Procedure: LUMBAR LAMINECTOMY/DECOMPRESSION MICRODISCECTOMY 1 LEVEL;  Surgeon: Clydene FakeJames R Hirsch, MD;  Location: [facility];  Service: Neurosurgery;  Laterality: Bilateral;  Bilateral L4-5 Laminectomy  . NO PAST SURGERIES     Family History No bleeding or clotting disorders  Social History Social History   Substance Use Topics   . Smoking status: Never Smoker   . Smokeless tobacco: Never Used   . Alcohol use Yes     Comment: weekly           Allergies  Allergen Reactions  . Shellfish Allergy Swelling    Throat swells          Current Outpatient Prescriptions  Medication Sig Dispense Refill  . cephALEXin (KEFLEX) 500 MG capsule TK 1 C PO TID  0  . cetirizine (ZYRTEC) 10 MG tablet Take by mouth.    . Multiple Vitamins-Minerals (HAIR/SKIN/NAILS PO) Take 1 tablet by mouth daily.    . Multiple Vitamins-Minerals (MULTIVITAMIN WITH MINERALS) tablet Take 1 tablet by mouth daily.    .  Vitamins C E (CRANBERRY CONCENTRATE PO) Take 84 mg by mouth daily.    . calcium carbonate (OS-CAL) 600 MG TABS Take 600 mg by mouth daily.    . Chlorpheniramine Maleate (CHLOR-TABLETS PO) Take 1 tablet by mouth daily.    . cyclobenzaprine (FLEXERIL) 10 MG tablet Take 10 mg by mouth 3 (three) times daily as needed for muscle spasms.    Marland Kitchen. gabapentin (NEURONTIN) 300 MG capsule Take 1 capsule (300 mg total) by mouth at bedtime. (Patient not taking: Reported on 02/26/2017) 10 capsule 0  . glucosamine-chondroitin 500-400 MG tablet Take 1 tablet by mouth daily.    Marland Kitchen. HYDROcodone-acetaminophen (NORCO) 10-325 MG per tablet Take 1 tablet by mouth 4 (four) times daily as needed for pain.    . meloxicam (MOBIC) 7.5 MG tablet Take 7.5 mg by mouth 2 (two) times daily.    . Prenat-FeCbn-FeBisg-FA-Omega (MULTIVITAMIN/MINERALS PO) Take by mouth.    . sucralfate (CARAFATE) 1 G tablet Take 1 g by mouth daily.     No current facility-administered medications for this visit.       REVIEW OF SYSTEMS (Negative unless checked)  Constitutional: [] Weight loss  [] Fever  [] Chills Cardiac: [] Chest pain   [] Chest pressure   [] Palpitations   [] Shortness of breath when laying flat   [] Shortness of breath at rest   [] Shortness of breath with exertion. Vascular:  [] Pain in legs with walking   [] Pain in legs at rest   []   Pain in legs when laying flat   [] Claudication   [] Pain in feet when walking  [] Pain in feet at rest  [] Pain in feet when laying flat   [] History of DVT   [] Phlebitis   [] Swelling in legs   [x] Varicose veins   [] Non-healing ulcers Pulmonary:   [] Uses home oxygen   [] Productive cough   [] Hemoptysis   [] Wheeze  [] COPD   [] Asthma Neurologic:  [] Dizziness  [] Blackouts   [] Seizures   [] History of stroke   [] History of TIA  [] Aphasia   [] Temporary blindness   [] Dysphagia   [] Weakness or numbness in arms   [] Weakness or numbness in legs Musculoskeletal:  [] Arthritis   [] Joint swelling   [] Joint  pain   [x] Low back pain Hematologic:  [] Easy bruising  [] Easy bleeding   [] Hypercoagulable state   [] Anemic  [] Hepatitis Gastrointestinal:  [] Blood in stool   [] Vomiting blood  [] Gastroesophageal reflux/heartburn   [] Abdominal pain Genitourinary:  [] Chronic kidney disease   [] Difficult urination  [] Frequent urination  [] Burning with urination   [] Hematuria Skin:  [] Rashes   [] Ulcers   [] Wounds Psychological:  [] History of anxiety   []  History of major depression.         Physical Exam BP (!) 148/89 (BP Location: Right Arm)   Pulse 72   Resp 18   Wt 196 lb (88.9 kg)   BMI 25.16 kg/m  Gen:  WD/WN, NAD Head: Helen/AT, No temporalis wasting.  Ear/Nose/Throat: Hearing grossly intact, dentition good Eyes: Sclera non-icteric. Conjunctiva clear Neck: Supple. Trachea midline Pulmonary:  Good air movement, no use of accessory muscles, respirations not labored.  Cardiac: RRR, No JVD Vascular: Varicosities scattered and measuring up to 1-2 mm in the right lower extremity        Varicosities diffuse and measuring up to 3 mm in the left lower extremity Vessel Right Left  Radial Palpable Palpable                          PT Palpable Palpable  DP Palpable Palpable    Musculoskeletal: M/S 5/5 throughout.   No RLE edema.  Trace LLE edema Neurologic: Sensation grossly intact in extremities.  Symmetrical.  Speech is fluent.  Psychiatric: Judgment intact, Mood & affect appropriate for pt's clinical situation. Dermatologic: No rashes or ulcers noted.  No cellulitis or open wounds. Lymph : No Cervical, Axillary, or Inguinal lymphadenopathy.   Radiology No results found.  Labs No results found for this or any previous visit (from the past 2160 hour(s)).  Assessment/Plan:  Varicose veins of leg with pain, left     The patient has done their best to comply with conservative therapy of 20-30 mm Hg compression stockings, leg elevation, exercise, and anti-inflammatories as needed for  discomfort.  Despite this, they continue to have daily and persistent symptoms from their venous disease.  A venous reflux study demonstrates no DVT or superficial thrombophlebitis, but significant left great saphenous vein reflux was seen.  As such, the patient is likely to benefit from endovenous laser ablation of the left great saphenous vein.  Risks and benefits of the procedure including bleeding, infection, recanalization, DVT, and need for further therapy for residual varicosities were discussed.  The patient voices their understanding and is agreeable to proceed with left great saphenous vein laser ablation.     Festus Barren 07/23/2017, 4:27 PM

## 2017-07-23 NOTE — Patient Instructions (Signed)
Nonsurgical Procedures for Varicose Veins Various nonsurgical procedures can be used to treat varicose veins. Varicose veins are swollen, twisted veins that are visible under the skin. They occur most often in the legs. These veins may appear blue and bulging. Varicose veins are caused by damage to the valves in veins. All veins have a valve that makes blood flow in only one direction. If a valve gets weak or damaged, blood can pool and cause varicose veins. You may need a procedure to treat your varicose veins if they are causing symptoms or complications, or if lifestyle changes have not helped. These procedures can reduce pain, aching, and the risk of bleeding and blood clots. They can also improve the way the affected area looks (cosmetic appearance). The three common nonsurgical procedures are:  Sclerotherapy. A chemical is injected to close off a vein.  Laser treatment. Light energy is applied to close off the vein.  Radiofrequency vein ablation. Electrical energy is used to produce heat that closes off the vein.  Your health care provider will discuss the method that is best for you based on your condition. Tell a health care provider about:  Any allergies you have.  All medicines you are taking, including vitamins, herbs, eye drops, creams, and over-the-counter medicines.  Any problems you or family members have had with anesthetic medicines.  Any blood disorders you have.  Any surgeries you have had.  Any medical conditions you have.  Whether you are pregnant or may be pregnant. What are the risks? Generally, this is a safe procedure. However, problems may occur, including:  Damage to nearby nerves, tissues, or veins.  Skin irritation, sores, or dark spots.  Numbness.  Clotting.  Infection.  Allergic reactions to medicines.  Scarring.  Leg swelling.  Need for additional treatments.  Bruising.  What happens before the procedure?  Ask your health care  provider about: ? Changing or stopping your regular medicines. This is especially important if you are taking diabetes medicines or blood thinners. ? Taking over-the-counter medicines, vitamins, herbs, and supplements. ? Taking medicines such as aspirin and ibuprofen. These medicines can thin your blood. Do not take these medicines unless your health care provider tells you to take them.  You may have an exam or testing. This can include a tests to: ? Check for clots and check blood flow using sound waves (Doppler ultrasound). ? Observe how blood flows through your veins by injecting a dye that outlines your veins on X-rays (angiogram). This test is used in rare cases. What happens during the procedure? One of the following procedures will be performed: Sclerotherapy This procedure is often used for small to medium veins.  A chemical (sclerosant) that irritates the lining of the vein will be injected into the vein. This will cause the varicose vein to be closed off. Sclerosants in different amounts and strengths can be used, depending on the size and location of the vein.  All of the varicose vein sites will be injected. You may need more than one treatment because new varicose veins may develop, or more than one injection may be needed for each varicose vein.  Laser treatment There are two ways that lasers are used to treat varicose veins:  Light energy from a laser may be directed onto the vein through the skin.  A needle may be used to pass a thin laser catheter into the vein to cause it to close.  You may need more than one treatment if the vein re-opens.   In some cases, laser treatment may be combined with sclerotherapy. Radiofrequency vein ablation  You will be given a medicine that numbs the area (local anesthetic).  A small incision will be made near the varicose vein.  A thin tube (catheter) will be threaded into your vein.  The tip of the catheter will deploy  electrodes.  The electrodes will deliver electrical energy to produce heat that closes off the vein. What happens after the procedure?  A bandage (dressing) may be used to cover the injection site or incisions.  You may have to wear compression stockings. These stockings help to prevent blood clots and reduce swelling in your legs.  Return to your normal activities as told by your health care provider. Summary  Varicose veins are swollen, twisted veins that are visible under the skin. They occur most often in the legs.  Various procedures can be used to treat varicose veins. You may need a procedure to treat your varicose veins if they are causing symptoms or complications, or if lifestyle changes have not helped.  Your health care provider will discuss the method that is best for you based on your condition. This information is not intended to replace advice given to you by your health care provider. Make sure you discuss any questions you have with your health care provider. Document Released: 08/14/2016 Document Revised: 08/14/2016 Document Reviewed: 08/14/2016 Elsevier Interactive Patient Education  2018 [employer]  

## 2017-09-24 ENCOUNTER — Other Ambulatory Visit (INDEPENDENT_AMBULATORY_CARE_PROVIDER_SITE_OTHER): Payer: BLUE CROSS/BLUE SHIELD | Admitting: Vascular Surgery

## 2017-09-27 ENCOUNTER — Encounter (INDEPENDENT_AMBULATORY_CARE_PROVIDER_SITE_OTHER): Payer: BLUE CROSS/BLUE SHIELD

## 2023-10-28 DIAGNOSIS — M47816 Spondylosis without myelopathy or radiculopathy, lumbar region: Secondary | ICD-10-CM | POA: Insufficient documentation

## 2024-03-24 ENCOUNTER — Other Ambulatory Visit: Payer: Self-pay

## 2024-03-24 ENCOUNTER — Inpatient Hospital Stay
Admission: RE | Admit: 2024-03-24 | Discharge: 2024-03-24 | Disposition: A | Discharge status: Home / Self Care | Payer: Self-pay | Source: Ambulatory Visit | Attending: Physician Assistant | Admitting: Physician Assistant

## 2024-03-24 DIAGNOSIS — Z049 Encounter for examination and observation for unspecified reason: Secondary | ICD-10-CM

## 2024-03-27 ENCOUNTER — Encounter: Payer: Self-pay | Admitting: Orthopedic Surgery

## 2024-03-27 NOTE — Progress Notes (Unsigned)
 Referring Physician:  Derick Leita POUR, MD [address] [place],  [place] [address]  Primary Physician:  Derick Leita POUR, MD  History of Present Illness: 03/29/2024 [name] has a history of varicose veins.   History of lumbar decompression L4-L5 in 2014. He had improvement in pain for 10 years after this surgery.   He notes 1-2 year history of constant LBP with bilateral leg pain to his feet (location varies, can be front, back, or side of leg). He notes muscle spasms, numbness, and tingling in his legs. Had episode of right leg giving way x 2 in last week. Pain is worse with laying down and walking. He notes muscle twitching in his arms, chest, and legs. Constant in arms x 2 years. Some improvement with medications and inversion table at home.   PCP started him on baclofen and prednisone- not sure it has helped. He is also taking neurontin  and norco.   Tobacco use: Does not smoke.   Bowel/Bladder Dysfunction: none  Conservative measures:  Physical therapy: has not participated in, inversion table at home.  Multimodal medical therapy including regular antiinflammatories: Baclofen, Prednisone, Ibuprofen Injections: no epidural steroid injections since around 2014  Past Surgery:  11/15/2012-LUMBAR LAMINECTOMY/DECOMPRESSION MICRODISCECTOMY   Aaron Wright has no symptoms of cervical myelopathy.  The symptoms are causing a significant impact on the patient's life.   Review of Systems:  A 10 point review of systems is negative, except for the pertinent positives and negatives detailed in the HPI.  Past Medical History: Past Medical History:  Diagnosis Date   Medical history non-contributory     Past Surgical History: Past Surgical History:  Procedure Laterality Date   LUMBAR LAMINECTOMY/DECOMPRESSION MICRODISCECTOMY Bilateral 11/15/2012   Procedure: LUMBAR LAMINECTOMY/DECOMPRESSION MICRODISCECTOMY 1 LEVEL;  Surgeon: Lynwood JONELLE Mill, MD;  Location: [facility];   Service: Neurosurgery;  Laterality: Bilateral;  Bilateral L4-5 Laminectomy   NO PAST SURGERIES      Allergies: Allergies as of 03/29/2024 - Review Complete 03/29/2024  Allergen Reaction Noted   Shellfish allergy Swelling 02/05/2017    Medications: Outpatient Encounter Medications as of 03/29/2024  Medication Sig   calcium carbonate (OS-CAL) 600 MG TABS Take 600 mg by mouth daily.   cephALEXin (KEFLEX) 500 MG capsule TK 1 C PO TID   cetirizine (ZYRTEC) 10 MG tablet Take by mouth.   Chlorpheniramine Maleate (CHLOR-TABLETS PO) Take 1 tablet by mouth daily.   cyclobenzaprine  (FLEXERIL ) 10 MG tablet Take 10 mg by mouth 3 (three) times daily as needed for muscle spasms.   fluticasone (FLONASE) 50 MCG/ACT nasal spray Place 2 sprays into both nostrils daily.   gabapentin  (NEURONTIN ) 300 MG capsule Take 1 capsule (300 mg total) by mouth at bedtime. (Patient not taking: Reported on 07/23/2017)   glucosamine-chondroitin 500-400 MG tablet Take 1 tablet by mouth daily.   HYDROcodone -acetaminophen  (NORCO) 10-325 MG per tablet Take 1 tablet by mouth 4 (four) times daily as needed for pain.   meloxicam (MOBIC) 7.5 MG tablet Take 7.5 mg by mouth 2 (two) times daily.   Multiple Vitamin (MULTIVITAMIN WITH MINERALS) TABS tablet Take 1 tablet by mouth daily.   Multiple Vitamins-Minerals (HAIR/SKIN/NAILS PO) Take 1 tablet by mouth daily.   Polyethylene Glycol 3350 (PEG 3350) POWD Take as directed for colonoscopy   Prenat-FeCbn-FeBisg-FA-Omega (MULTIVITAMIN/MINERALS PO) Take by mouth.   ranitidine (ZANTAC) 150 MG tablet Take by mouth.   sucralfate  (CARAFATE ) 1 G tablet Take 1 g by mouth daily.   Vitamins C E (  CRANBERRY CONCENTRATE PO) Take 84 mg by mouth daily.   No facility-administered encounter medications on file as of 03/29/2024.    Social History: Social History   Tobacco Use   Smoking status: Never   Smokeless tobacco: Never  Substance Use Topics   Alcohol use: Yes    Comment: weekly   Drug  use: No    Family Medical History: No family history on file.  Physical Examination: Vitals:   03/29/24 1513 03/29/24 1544  BP: (!) 150/98 130/86    General: Patient is well developed, well nourished, calm, collected, and in no apparent distress. Attention to examination is appropriate.  Respiratory: Patient is breathing without any difficulty.   NEUROLOGICAL:     Awake, alert, oriented to person, place, and time.  Speech is clear and fluent. Fund of knowledge is appropriate.   Cranial Nerves: Pupils equal round and reactive to light.  Facial tone is symmetric.    Well healed lumbar incision.  No lower lumbar tenderness.   No abnormal lesions on exposed skin.   Strength: Side Biceps Triceps Deltoid Interossei Grip Wrist Ext. Wrist Flex.  R 5 5 5 5 5 5 5   L 5 5 5 5 5 5 5    Side Iliopsoas Quads Hamstring PF DF EHL  R 5 5 5 5 5 5   L 5 5 5 5 5 5    Reflexes are 2+ and symmetric at the biceps, brachioradialis, patella and achilles.   Hoffman's is absent.  Clonus is not present.   Bilateral upper and lower extremity sensation is intact to light touch, but subjectively diminished in right LE from knee to foot.   He has muscle vesiculations in both arms in triceps region.   He has slight limp favoring right leg.    Medical Decision Making  Imaging: Lumbar xrays dated 10/28/23:  Diffuse lumbar spondylosis and DDD.   No report for above xrays.   Assessment and Plan: Aaron Wright has a history of lumbar decompression L4-L5 in 2014. He had improvement in pain for 10 years after this surgery.   He notes 1-2 year history of constant LBP with bilateral leg pain to his feet (location varies, can be front, back, or side of leg). He notes muscle spasms, numbness, and tingling in his legs. Had episode of right leg giving way x 2 in last week.   He has known diffuse lumbar spondylosis and DDD.    He notes muscle twitching in his arms, chest, and legs as well. He's had constant  twitching in arms x 2 years. No neck or arm pain. No numbness, tingling, or weakness.   Treatment options discussed with patient and following plan made:   - MRI of lumbar spine to further evaluate back and leg pain.  - Continue on neurontin . Discuss further norco refills with PCP.  - Referral to neurology at [facility] for vesiculations. Recommend he see Dr. Maree.   - Will schedule follow up visit to review MRI results once I get them back. Can do phone, MyChart, or in person.   I spent a total of 45 minutes in face-to-face and non-face-to-face activities related to this patient's care today including review of outside records, review of imaging, review of symptoms, physical exam, discussion of differential diagnosis, discussion of treatment options, and documentation.   Thank you for involving me in the care of this patient.   Glade Boys PA-C Dept. of Neurosurgery

## 2024-03-29 ENCOUNTER — Encounter: Payer: Self-pay | Admitting: Orthopedic Surgery

## 2024-03-29 ENCOUNTER — Ambulatory Visit: Payer: Self-pay | Admitting: Orthopedic Surgery

## 2024-03-29 VITALS — BP 130/86 | Ht 74.0 in | Wt 182.0 lb

## 2024-03-29 DIAGNOSIS — Z9889 Other specified postprocedural states: Secondary | ICD-10-CM

## 2024-03-29 DIAGNOSIS — R253 Fasciculation: Secondary | ICD-10-CM | POA: Diagnosis not present

## 2024-03-29 DIAGNOSIS — M51362 Other intervertebral disc degeneration, lumbar region with discogenic back pain and lower extremity pain: Secondary | ICD-10-CM | POA: Diagnosis not present

## 2024-03-29 DIAGNOSIS — M5416 Radiculopathy, lumbar region: Secondary | ICD-10-CM

## 2024-03-29 DIAGNOSIS — M47816 Spondylosis without myelopathy or radiculopathy, lumbar region: Secondary | ICD-10-CM | POA: Diagnosis not present

## 2024-03-29 NOTE — Patient Instructions (Addendum)
 It was so nice to see you today. Thank you so much for coming in.    You have some wear and tear (arthritis) in your back.  I want to get an MRI of your lower back to look into things further. We will get this approved through your insurance and [facility] will call you to schedule the appointment. Ask about your patient responsibility. You do not need to pay this prior to getting MRI, they can bill you.    [facility] is located at [address] in [place]. This is near the intersection of [address] and [address]   After you have the MRI, it can take 14-28 days for me to get the results back. If I don't have them in 2 weeks, we will call to try to get the results.   Once I have the results, we will call you to schedule a follow up visit with me to review them.   For the muscle twitching, I recommend that you see neurology at the [facility]. I want you to see Dr. Maree. They should call you, but their number is [phone].   Please do not hesitate to call if you have any questions or concerns. You can also message me in MyChart.   Glade Boys PA-C [phone]     The physicians and staff at [facility] are committed to providing excellent care. You may receive a survey asking for feedback about your experience at our office. We value you your feedback and appreciate you taking the time to to fill it out. The [facility] leadership team is also available to discuss your experience in person, feel free to contact us  [phone].

## 2024-03-30 ENCOUNTER — Telehealth: Payer: Self-pay | Admitting: Orthopedic Surgery

## 2024-03-30 NOTE — Telephone Encounter (Signed)
 We did not discuss any mid back or thoracic pain at his visit. Is he having pain in this area?   I think his low back and leg pain is from the lower back.

## 2024-03-30 NOTE — Telephone Encounter (Signed)
 If he is not having any pain in his mid back, I do not recommend getting an MRI scan. I would get the lumbar spine and see what that shows.

## 2024-03-30 NOTE — Telephone Encounter (Signed)
 Patient states he is not having any mid back pain or thoracic pain, his brother has had back surgeries and issues and had similar symptoms as the patient has and his issue was not found in the lower back but in mid back and brother had to go back after MRI lumbar to get MRI Thoracic scan done and make 2 trips. Patient wanted to see if he could have both done at the same time to check both levels in case this is the scenario for him also?

## 2024-03-30 NOTE — Telephone Encounter (Signed)
 Pt is wanting to get a thoracic spine mri as well as the lumbar. Please send this over.

## 2024-03-30 NOTE — Telephone Encounter (Signed)
 Patient advised.

## 2024-04-17 ENCOUNTER — Inpatient Hospital Stay
Admission: RE | Admit: 2024-04-17 | Discharge: 2024-04-17 | Disposition: A | Payer: Self-pay | Source: Ambulatory Visit | Attending: Orthopedic Surgery | Admitting: Orthopedic Surgery

## 2024-04-17 ENCOUNTER — Other Ambulatory Visit: Payer: Self-pay

## 2024-04-17 DIAGNOSIS — Z049 Encounter for examination and observation for unspecified reason: Secondary | ICD-10-CM

## 2024-04-21 NOTE — Progress Notes (Addendum)
 Telephone Visit- Progress Note: Referring Physician:  Derick Leita POUR, MD [address] [place],  [place] [address]  Primary Physician:  Derick Leita POUR, MD  This visit was performed via telephone.  Patient location: home Provider location: office  I spent a total of 20 minutes non-face-to-face activities for this visit on the date of this encounter including review of current clinical condition and response to treatment.    Patient has given verbal consent to this telephone visits and we reviewed the limitations of a telephone visit. Patient wishes to proceed.    Chief Complaint:  review imaging  History of Present Illness: Aaron Wright is a 48 y.o. male has a history of  varicose veins.    History of lumbar decompression L4-L5 in 2014. He had improvement in pain for 10 years after this surgery.   Last seen by me on 03/29/24 for constant LBP with bilatearl leg pain along with diffuse muscle twitching x 2 years. He has known diffuse lumbar spondylosis and DDD.   He was sent to neurology at [facility] for vesiculations- he has appointment on 05/08/24. He was to continue neurontin . Phone visit scheduled to review his lumbar MRI.  He has seen improvement in his back pain since his last visit. He still has constant LBP, but it is not as severe. He still notes intermittent bilateral leg pain to his feet (location varies, can be front, back, or side of leg). He has seen improvement in numbness and tingling in legs. Still notes some feelings of giving way in right leg. He has been trying to modify his activity and this has helped his pain.   No change in muscle twitching in his arms, chest, and legs. Constant in arms x 2 years.    He is taking neurontin , flexeril , and norco.    Tobacco use: Does not smoke.    Bowel/Bladder Dysfunction: none   Conservative measures:  Physical therapy: has not participated in, inversion table at home.  Multimodal medical therapy including regular  antiinflammatories: Baclofen, Prednisone, Ibuprofen Injections: no epidural steroid injections since around 2014   Past Surgery:  11/15/2012-LUMBAR LAMINECTOMY/DECOMPRESSION MICRODISCECTOMY    The symptoms are causing a significant impact on the patient's life.   Exam: No exam done as this was a telephone encounter.     Imaging: Lumbar MRI dated 04/15/24:  FINDINGS:  There are 5 lumbar-type vertebrae. The conus medullaris terminates at a normal level. Trace grade 1 retrolisthesis of L2 on L3 and L4 on L5. The vertebral body heights are maintained. Moderate L4-L5 intervertebral disc height loss with mild height loss at the remaining levels. Edematous endplate signal changes throughout the lumbar spine most prominent at L3-L4 and T12-L1, nonspecific but often secondary to Modic type I degenerative changes. Moderate leftward apex curvature of the lumbar spine centered at L4-L5. Left L5-S1 pseudarthrosis.   L1-2: Disc bulge contributes to mild/moderate spinal canal stenosis and along with facet arthrosis contributes to mild/moderate left and mild right neural foraminal stenosis.  L2-3: Disc bulge contributes to moderate/severe spinal canal stenosis and along with facet arthrosis contributes to mild left neural foraminal stenosis.  L3-4: Disc bulge and ligamentum flavum thickening contribute to severe spinal canal stenosis. Additionally, there is a right subarticular disc extrusion with superior migration which narrows the right lateral recess contacting right-sided nerve roots. Facet arthrosis contributes to mild/moderate left and mild right neural foraminal stenosis.  L4-5: Disc bulge contributes to mild spinal canal stenosis and along with facet arthrosis contributes to  mild/moderate bilateral neural foraminal stenosis.  L5-S1: No significant spinal canal stenosis. Disc bulge. Facet arthrosis. Moderate/severe right neural foraminal stenosis.    IMPRESSION:  Severe degenerative changes of the  lumbar spine.   Likely disc extrusion at L3-L4, however recommend a contrast-enhanced MRI to exclude alternative etiologies such as a peripheral nerve sheath tumor.   Electronically Signed by: Valma Companion, MD on 04/17/2024 2:14 PM   I have personally reviewed the images and agree with the above interpretation.  Assessment and Plan: [name] has a history of lumbar decompression L4-L5 in 2014. He had improvement in pain for 10 years after this surgery.    He has seen improvement in his back pain since his last visit. He still has constant LBP, but it is not as severe. He still notes intermittent bilateral leg pain to his feet (location varies, can be front, back, or side of leg). He has seen improvement in numbness and tingling in legs. Still notes some feelings of giving way in right leg.   He has known diffuse lumbar spondylosis and DDD. He has mild central stenosis L1-L2 and L4-L5, moderate central stenosis L2-L3. He has multilevel foraminal stenosis that is severe on right at L5-S1. He has right sided disc with superior extrusion L3-L4, severe central stenosis, and mild/moderate left with mild right foraminal stenosis.   No change in muscle twitching in his arms, chest, and legs. Constant in arms x 2 years.   Treatment options discussed with patient and following plan made:   - Recommend PT for lumbar spine.  - Discussed injections for lumbar spine as well.  - Per radiology, likely disc extrusion at L3-L4, however recommend a contrast-enhanced MRI to exclude alternative etiologies such as a peripheral nerve sheath tumor. Will discuss with Dr. Claudene and let him know.  - Do not think vesiculations are spine mediated. Keep scheduled appointment with neurology at [facility] on 05/08/24.  - Will message him with above plan and Dr. Theressa recommendations regarding MRI. He will discuss with his wife and let me know how he wants to proceed.   ADDENDUM 04/27/24:  MRI reviewed with Dr. Claudene  specifically area at L3-L4. He thinks this is likely an extruded disc, but agrees with MRI with and without contrast to look into things further. Patient sent a message.   Glade Boys PA-C Neurosurgery

## 2024-04-24 ENCOUNTER — Ambulatory Visit: Payer: Self-pay | Admitting: Physician Assistant

## 2024-04-26 ENCOUNTER — Ambulatory Visit: Admitting: Orthopedic Surgery

## 2024-04-26 ENCOUNTER — Encounter: Payer: Self-pay | Admitting: Orthopedic Surgery

## 2024-04-26 DIAGNOSIS — M4726 Other spondylosis with radiculopathy, lumbar region: Secondary | ICD-10-CM

## 2024-04-26 DIAGNOSIS — M51362 Other intervertebral disc degeneration, lumbar region with discogenic back pain and lower extremity pain: Secondary | ICD-10-CM | POA: Diagnosis not present

## 2024-04-26 DIAGNOSIS — M48061 Spinal stenosis, lumbar region without neurogenic claudication: Secondary | ICD-10-CM

## 2024-04-26 DIAGNOSIS — M5416 Radiculopathy, lumbar region: Secondary | ICD-10-CM

## 2024-04-26 DIAGNOSIS — M47816 Spondylosis without myelopathy or radiculopathy, lumbar region: Secondary | ICD-10-CM

## 2024-04-27 DIAGNOSIS — M47816 Spondylosis without myelopathy or radiculopathy, lumbar region: Secondary | ICD-10-CM

## 2024-04-27 DIAGNOSIS — M5416 Radiculopathy, lumbar region: Secondary | ICD-10-CM

## 2024-04-27 DIAGNOSIS — M48061 Spinal stenosis, lumbar region without neurogenic claudication: Secondary | ICD-10-CM

## 2024-04-28 NOTE — Telephone Encounter (Signed)
 Thank you for ordering. When you send MRI order, be sure to add that he has shellfish allergy.   Thanks!

## 2024-04-28 NOTE — Telephone Encounter (Signed)
 This has been added into the order under notes.

## 2024-05-19 ENCOUNTER — Encounter: Payer: Self-pay | Admitting: Orthopedic Surgery

## 2024-05-19 ENCOUNTER — Other Ambulatory Visit: Payer: Self-pay

## 2024-05-19 ENCOUNTER — Inpatient Hospital Stay
Admission: RE | Admit: 2024-05-19 | Discharge: 2024-05-19 | Disposition: A | Discharge status: Home / Self Care | Payer: Self-pay | Source: Ambulatory Visit | Attending: Orthopedic Surgery | Admitting: Orthopedic Surgery

## 2024-05-19 DIAGNOSIS — Z049 Encounter for examination and observation for unspecified reason: Secondary | ICD-10-CM

## 2024-05-19 NOTE — Telephone Encounter (Signed)
 Lumbar MRI dated 05/13/24:  FINDINGS: #  Lumbar alignment is preserved. #  Vertebral body heights are well maintained. #  The marrow signal intensity is normal. #  Conus terminates at L1 without evidence of tethering. #  Nerve roots appear normal. #  There is no abnormal enhancement. #  Incidental findings: None.  #  L1-2: Mild facet arthropathy and disc bulge causing mild central canal stenosis unchanged. There is mild left neuroforaminal narrowing. #   #  L2-3: Mild degenerative disc disease. Facet arthropathy and disc bulge with protrusion causing moderate central canal stenosis unchanged. There is mild bilateral neuroforaminal narrowing. #   #  L3-4: Moderate degenerative disc disease. There is facet arthropathy, ligament hypertrophy, and disc osteophyte causing severe central canal stenosis unchanged. There is moderate bilateral narrowing. #   #  L4-5: Moderate degenerative disc disease. Interval left hemilaminectomy with well-maintained central canal. There is moderate bilateral neuroforaminal narrowing. #   #  L5-S1: Moderate degenerative disc disease. Facet arthropathy and disc osteophyte causing moderate neuroforaminal narrowing. No significant central canal stenosis.   IMPRESSION:  1.   Interval L4-5 with left hemilaminectomy with well-maintained central canal.  2.  Lumbar spondylosis most significant at L3-4 with facet arthropathy and disc osteophyte causes severe central canal stenosis unchanged from April 15, 2024.  Electronically Signed by: Dallas Jubilee, MD on 05/15/2024 1:31 PM   I have personally reviewed the images and agree with the above interpretation.
